# Patient Record
Sex: Female | Born: 1955 | Race: White | Hispanic: No | Marital: Married | Smoking: Former smoker
Health system: Southern US, Community
[De-identification: ages and names within clinical notes are randomized; demographics above are authoritative.]

## PROBLEM LIST (undated history)

## (undated) DIAGNOSIS — K219 Gastro-esophageal reflux disease without esophagitis: Secondary | ICD-10-CM

## (undated) DIAGNOSIS — E669 Obesity, unspecified: Secondary | ICD-10-CM

## (undated) DIAGNOSIS — W19XXXA Unspecified fall, initial encounter: Secondary | ICD-10-CM

## (undated) DIAGNOSIS — I10 Essential (primary) hypertension: Secondary | ICD-10-CM

## (undated) DIAGNOSIS — R0600 Dyspnea, unspecified: Secondary | ICD-10-CM

## (undated) DIAGNOSIS — R002 Palpitations: Secondary | ICD-10-CM

## (undated) DIAGNOSIS — F341 Dysthymic disorder: Secondary | ICD-10-CM

## (undated) DIAGNOSIS — M1990 Unspecified osteoarthritis, unspecified site: Secondary | ICD-10-CM

## (undated) DIAGNOSIS — F419 Anxiety disorder, unspecified: Secondary | ICD-10-CM

## (undated) HISTORY — DX: Dysthymic disorder: F34.1

## (undated) HISTORY — DX: Gastro-esophageal reflux disease without esophagitis: K21.9

## (undated) HISTORY — DX: Anxiety disorder, unspecified: F41.9

## (undated) HISTORY — DX: Dyspnea, unspecified: R06.00

## (undated) HISTORY — DX: Palpitations: R00.2

## (undated) HISTORY — DX: Essential (primary) hypertension: I10

## (undated) HISTORY — DX: Unspecified osteoarthritis, unspecified site: M19.90

## (undated) HISTORY — DX: Obesity, unspecified: E66.9

## (undated) HISTORY — DX: Unspecified fall, initial encounter: W19.XXXA

---

## 2016-10-26 ENCOUNTER — Other Ambulatory Visit: Payer: Self-pay | Admitting: Internal Medicine

## 2016-10-26 DIAGNOSIS — Z1231 Encounter for screening mammogram for malignant neoplasm of breast: Secondary | ICD-10-CM

## 2016-11-28 ENCOUNTER — Ambulatory Visit
Admission: RE | Admit: 2016-11-28 | Discharge: 2016-11-28 | Disposition: A | Discharge status: Home / Self Care | Payer: BLUE CROSS/BLUE SHIELD | Source: Ambulatory Visit | Attending: Internal Medicine | Admitting: Internal Medicine

## 2016-11-28 ENCOUNTER — Encounter: Payer: Self-pay | Admitting: Radiology

## 2016-11-28 DIAGNOSIS — Z1231 Encounter for screening mammogram for malignant neoplasm of breast: Secondary | ICD-10-CM

## 2016-12-29 ENCOUNTER — Encounter: Payer: Self-pay | Admitting: Internal Medicine

## 2017-06-03 ENCOUNTER — Emergency Department (HOSPITAL_BASED_OUTPATIENT_CLINIC_OR_DEPARTMENT_OTHER)
Admission: EM | Admit: 2017-06-03 | Discharge: 2017-06-03 | Disposition: A | Discharge status: Home / Self Care | Payer: BC Managed Care – PPO | Attending: Emergency Medicine | Admitting: Emergency Medicine

## 2017-06-03 ENCOUNTER — Encounter (HOSPITAL_BASED_OUTPATIENT_CLINIC_OR_DEPARTMENT_OTHER): Payer: Self-pay | Admitting: Emergency Medicine

## 2017-06-03 ENCOUNTER — Emergency Department (HOSPITAL_BASED_OUTPATIENT_CLINIC_OR_DEPARTMENT_OTHER): Payer: BC Managed Care – PPO

## 2017-06-03 DIAGNOSIS — W19XXXA Unspecified fall, initial encounter: Secondary | ICD-10-CM

## 2017-06-03 DIAGNOSIS — Y929 Unspecified place or not applicable: Secondary | ICD-10-CM | POA: Insufficient documentation

## 2017-06-03 DIAGNOSIS — Y9301 Activity, walking, marching and hiking: Secondary | ICD-10-CM | POA: Diagnosis not present

## 2017-06-03 DIAGNOSIS — W108XXA Fall (on) (from) other stairs and steps, initial encounter: Secondary | ICD-10-CM | POA: Diagnosis not present

## 2017-06-03 DIAGNOSIS — S32010A Wedge compression fracture of first lumbar vertebra, initial encounter for closed fracture: Secondary | ICD-10-CM | POA: Insufficient documentation

## 2017-06-03 DIAGNOSIS — Y999 Unspecified external cause status: Secondary | ICD-10-CM | POA: Insufficient documentation

## 2017-06-03 DIAGNOSIS — M549 Dorsalgia, unspecified: Secondary | ICD-10-CM

## 2017-06-03 DIAGNOSIS — S3992XA Unspecified injury of lower back, initial encounter: Secondary | ICD-10-CM | POA: Diagnosis present

## 2017-06-03 MED ORDER — MELOXICAM 15 MG PO TABS: 15.0000 mg | ORAL_TABLET | Freq: Every day | ORAL | 1 refills | Status: DC

## 2017-06-03 MED ORDER — CYCLOBENZAPRINE HCL 10 MG PO TABS: 10.0000 mg | ORAL_TABLET | Freq: Every evening | ORAL | 0 refills | Status: DC | PRN

## 2017-06-03 NOTE — ED Notes (Signed)
Alert, NAD, calm, interactive, resps e/u, speaking in clear complete sentences, no dyspnea noted, skin W&D, VSS, reports back pain mildly worse, pt repositioned for comfort, (denies: sob, numbness, tingling, nausea, dizziness or visual changes). Family at [facility].

## 2017-06-03 NOTE — ED Triage Notes (Signed)
PT presents with c/o low back pain after fall Thursday. PT sts she has been using biofreeze, ice and aleve at home. PT sts pain is like a burn now.

## 2017-06-03 NOTE — Discharge Instructions (Signed)
Your xray shows that you have a compression fracture of L1 or the first lumbar vertebrae. I have attached a informational handout on this. Please go to your local drug store and buy a corset back brace. This will prevent you from worsening the compression fracture by sudden movements such as flexion of the spine. Please follow up with neurosurgery in 1 month to ensure that you have routine healing of this area.   For pain control you may take: Mobic as prescribed (please take with food) and acetaminophen 975mg  (this is 3 normal strength, 325mg , over the counter pills) up to four times a day. Please do not take more than this. Do not drink alcohol or combine with other medications that have acetaminophen or Ibuprofen as an ingredient (Read the labels!).   Take muscle relaxers at bedtime as needed. These medications can make you drowsy. Please do not drive or operate heavy machinery while using these medications.   Be aware that if you develop new symptoms, such as a fever, leg weakness, difficulty with or loss of control of your urine or bowels, abdominal pain, or more severe pain, you will need to seek medical attention and/or return to the Emergency department. Additional Information:  Your vital signs today were: BP (!) 168/97 (BP Location: Left Arm)    Pulse 81    Temp 98.1 F (36.7 C) (Oral)    Resp 18    SpO2 100%  If your blood pressure (BP) was elevated above 135/85 this visit, please have this repeated by your doctor within one month. ---------------

## 2017-06-03 NOTE — ED Provider Notes (Signed)
[facility] EMERGENCY DEPARTMENT Provider Note   CSN: [id number] Arrival date & time: 06/03/17  1750     History   Chief Complaint Chief Complaint  Patient presents with  . Fall    HPI Paula Boyer is a 61 y.o. female with no significant past medical history presents to the department today for low back pain since Thursday. Patient states that she slipped at the top of her stairs, landing on her buttocks and sliding down 14 stairs. She denies any head trauma or loss of consciousness. She has been able to ambulate since the event but is having low back pain.she describes her pain as bilateral but primarily on the left side without radiation into the abdomen or down the legs. She has been using Biofreeze every 2 hours, ice and Aleve at home with only mild relief. She states the pain is like a burning, aching pain that she rates as a 2/10. Patient says that the pain is worse after long periods of rest and while lying down for long periods of time. Denies upper back pain or neck pain, numbness/tingling/weakness of the lower extremities, urinary retention, loss of bowel/bladder function or saddle anesthesia. No headache,visual changes or nausea/vomiting. Patient is not on anticoagulation medicine.  HPI  History reviewed. No pertinent past medical history.  There are no active problems to display for this patient.   History reviewed. No pertinent surgical history.  OB History    No data available       Home Medications    Prior to Admission medications   Not on File    Family History No family history on file.  Social History Social History  Substance Use Topics  . Smoking status: Never Smoker  . Smokeless tobacco: Never Used  . Alcohol use No     Allergies   Codeine; Erythromycin; Penicillins; and Sulfa antibiotics   Review of Systems Review of Systems  All other systems reviewed and are negative.    Physical Exam Updated Vital Signs BP (!) 168/97  (BP Location: Left Arm)   Pulse 81   Temp 98.1 F (36.7 C) (Oral)   Resp 18   SpO2 100%   Physical Exam  Constitutional: She appears well-developed and well-nourished. No distress.  Non-toxic appearing  HENT:  Head: Normocephalic and atraumatic.  Right Ear: External ear normal.  Left Ear: External ear normal.  Neck: Normal range of motion. Neck supple. No spinous process tenderness present. No neck rigidity. Normal range of motion present.  Cardiovascular: Normal rate, regular rhythm, normal heart sounds and intact distal pulses.   No murmur heard. Pulses:      Radial pulses are 2+ on the right side, and 2+ on the left side.       Femoral pulses are 2+ on the right side, and 2+ on the left side.      Dorsalis pedis pulses are 2+ on the right side, and 2+ on the left side.       Posterior tibial pulses are 2+ on the right side, and 2+ on the left side.  Pulmonary/Chest: Effort normal and breath sounds normal. No respiratory distress.  Abdominal: Soft. Bowel sounds are normal. She exhibits no pulsatile midline mass. There is no tenderness. There is no rigidity, no rebound and no CVA tenderness.  Musculoskeletal:  Posterior and appearance appears normal. No evidence of obvious scoliosis or kyphosis. No obvious signs of skin changes, trauma, deformity, infection. No C, T spine tenderness or step-offs to palpation. Lumbar  spine TTP. No step offs. No C, T, paraspinal tenderness. Bilateral lumbar paraspinal TTP greater on L. Lung expansion normal. Bilateral lower extremity strength 5 out of 5. Patellar and Achilles deep tendon reflex 2+ and equal bilaterally. Sensation of lower extremities grossly intact. Straight leg right neg. Straight leg left neg. Gait able but patient notes painful. Lower extremity compartments soft. PT and DP 2+ b/l. Cap refill <2 seconds.   Neurological: She is alert. She has normal strength. No sensory deficit.  Mental Status:  Alert, oriented, thought content  appropriate, able to give a coherent history. Speech fluent without evidence of aphasia. Able to follow 2 step commands without difficulty.  Cranial Nerves:  II:  Peripheral visual fields grossly normal, pupils equal, round, reactive to light III,IV, VI: ptosis not present, extra-ocular motions intact bilaterally  V,VII: smile symmetric, eyebrows raise symmetric, facial light touch sensation equal VIII: hearing grossly normal to voice  X: uvula elevates symmetrically  XI: bilateral shoulder shrug symmetric and strong XII: midline tongue extension without fassiculations Motor:  Normal tone. 5/5 in upper and lower extremities bilaterally including strong and equal grip strength and dorsiflexion/plantar flexion Sensory: Sensation intact to light touch in all extremities. Negative Romberg.  Deep Tendon Reflexes: 2+ and symmetric in the biceps and patella Cerebellar: normal finger-to-nose with bilateral upper extremities. Normal heel-to -shin balance bilaterally of the lower extremity. No pronator drift.  Gait: normal gait and balance CV: distal pulses palpable throughout    Skin: Skin is warm, dry and intact. Capillary refill takes less than 2 seconds. No rash noted. She is not diaphoretic. No erythema.  Nursing note and vitals reviewed.    ED Treatments / Results  Labs (all labs ordered are listed, but only abnormal results are displayed) Labs Reviewed - No data to display  EKG  EKG Interpretation None       Radiology Dg Thoracic Spine 2 View  Addendum Date: 06/03/2017   ADDENDUM REPORT: 06/03/2017 20:46 ADDENDUM: Please see the lumbar spine report for detail of a superior endplate compression of L1 better visualized on that study. Electronically Signed   By: Ashley Royalty M.D.   On: 06/03/2017 20:46   Result Date: 06/03/2017 CLINICAL DATA:  Patient fell down steps 4 days ago. Left-sided lumbar pain. Remote back injury 25 years ago. EXAM: THORACIC SPINE 2 VIEWS COMPARISON:  None.  FINDINGS: Twelve ribbed thoracic vertebrae. Maintained thoracic curvature on the lateral view. No acute thoracic spine fracture or bone destruction. The included posterior ribs are intact. Mild multilevel disc space narrowing along the mid to lower thoracic spine as well as at T12-L1. IMPRESSION: Mild thoracic spondylosis.  No acute fracture. Electronically Signed: By: Ashley Royalty M.D. On: 06/03/2017 20:34   Dg Lumbar Spine Complete  Result Date: 06/03/2017 CLINICAL DATA:  Back pain after fall down steps 4 days ago. EXAM: LUMBAR SPINE - COMPLETE 4+ VIEW COMPARISON:  None. FINDINGS: There are 5 non ribbed lumbar vertebrae. There is mild, up to 25% superior endplate compression of the L1 vertebral body without retropulsion. Disc spaces are maintained. There is mild multilevel degenerative facet arthropathy of the lumbar spine. IMPRESSION: Age-indeterminate mild superior endplate compression of L1 without retropulsion. Electronically Signed   By: Ashley Royalty M.D.   On: 06/03/2017 20:41   Dg Pelvis 1-2 Views  Result Date: 06/03/2017 CLINICAL DATA:  Pain after fall down steps four days ago. EXAM: PELVIS - 1-2 VIEW COMPARISON:  None. FINDINGS: There is no evidence of pelvic fracture or diastasis.  Hip joints are maintained without dislocation. The arcuate lines of the sacrum appear intact. The sacroiliac joints and pubic symphysis are maintained. The included lower lumbar spine is nonacute in appearance. No pelvic bone lesions are seen. Soft tissue calcification adjacent to the right greater trochanter may reflect calcific greater trochanteric bursitis or calcific gluteal tendinopathy. IMPRESSION: 1. No acute pelvic fracture. 2. Right greater trochanteric calcific bursitis or gluteal tendinopathy. Electronically Signed   By: Ashley Royalty M.D.   On: 06/03/2017 20:36    Procedures Procedures (including critical care time)  Medications Ordered in ED Medications - No data to display   Initial Impression /  Assessment and Plan / ED Course  I have reviewed the triage vital signs and the nursing notes.  Pertinent labs & imaging results that were available during my care of the patient were reviewed by me and considered in my medical decision making (see chart for details).     61 y.o. female with low back pain after slipping on stairs and landing on buttocks. No head trauma, or LOC. Patient is not on blood thinners. No HA, visual changes or N/V since event. No neurological deficits and normal neuro exam. Do not suspect closed head injury. Patient can walk but states is painful.  No loss of bowel or bladder control or urinary retention.  No concern for cauda equina. Xrays ordered to rule out fracture.   Patient with compression fracture of L1. No retropulsion.  Advised the patient to wear back corset and follow up with neurosurgery to ensure area is healing. Advised NSAIDs, activity modification. Patient denied pain medication in the ED. Return precautions discussed. The patient is in agreement with plan and appears safe for discharge. Patient case discussed with Dr. Leonette Monarch who is in agreement with plan.  Final Clinical Impressions(s) / ED Diagnoses   Final diagnoses:  Back pain  Closed compression fracture of first lumbar vertebra, initial encounter ([place])  Fall, initial encounter    New Prescriptions Discharge Medication List as of 06/03/2017  9:17 PM    START taking these medications   Details  cyclobenzaprine (FLEXERIL) 10 MG tablet Take 1 tablet (10 mg total) by mouth at bedtime as needed for muscle spasms., Starting Sun 06/03/2017, Print    meloxicam (MOBIC) 15 MG tablet Take 1 tablet (15 mg total) by mouth daily., Starting Sun 06/03/2017, Print         Duston Smolenski, [place]th Kirks, PA-C 06/03/17 2202    Fatima Blank, MD 06/04/17 0005

## 2017-06-04 ENCOUNTER — Encounter: Payer: Self-pay | Admitting: Radiology

## 2017-08-14 HISTORY — PX: COLONOSCOPY: SHX174

## 2017-12-14 ENCOUNTER — Encounter: Payer: Self-pay | Admitting: Gastroenterology

## 2018-01-23 ENCOUNTER — Encounter: Payer: Self-pay | Admitting: Gastroenterology

## 2018-02-25 ENCOUNTER — Ambulatory Visit (AMBULATORY_SURGERY_CENTER): Payer: Self-pay | Admitting: *Deleted

## 2018-02-25 VITALS — Ht 63.0 in | Wt 163.8 lb

## 2018-02-25 DIAGNOSIS — Z800 Family history of malignant neoplasm of digestive organs: Secondary | ICD-10-CM

## 2018-02-25 DIAGNOSIS — Z1211 Encounter for screening for malignant neoplasm of colon: Secondary | ICD-10-CM

## 2018-02-25 MED ORDER — NA SULFATE-K SULFATE-MG SULF 17.5-3.13-1.6 GM/177ML PO SOLN: 1.0000 | Freq: Once | ORAL | 0 refills | Status: AC

## 2018-02-25 NOTE — Progress Notes (Signed)
Denies allergies to eggs or soy products. Denies complications with sedation or anesthesia. Denies O2 use. Denies use of diet or weight loss medications.  Emmi instructions given for colonoscopy.  

## 2018-03-01 ENCOUNTER — Encounter: Payer: Self-pay | Admitting: Gastroenterology

## 2018-03-15 ENCOUNTER — Encounter: Payer: Self-pay | Admitting: Gastroenterology

## 2018-03-15 ENCOUNTER — Ambulatory Visit (AMBULATORY_SURGERY_CENTER): Payer: BC Managed Care – PPO | Admitting: Gastroenterology

## 2018-03-15 VITALS — BP 110/87 | HR 69 | Temp 97.8°F | Resp 12 | Ht 63.0 in | Wt 163.0 lb

## 2018-03-15 DIAGNOSIS — D123 Benign neoplasm of transverse colon: Secondary | ICD-10-CM

## 2018-03-15 DIAGNOSIS — Z1211 Encounter for screening for malignant neoplasm of colon: Secondary | ICD-10-CM | POA: Diagnosis not present

## 2018-03-15 MED ORDER — SODIUM CHLORIDE 0.9 % IV SOLN: 500.0000 mL | Freq: Once | INTRAVENOUS | Status: DC

## 2018-03-15 NOTE — Patient Instructions (Signed)
   INFORMATION ON POLYPS,DIVERTICULOSIS,& HEMORRHOIDS GIVEN TO YOU TODAY  AWAIT PATHOLOGY RESULTS    YOU HAD AN ENDOSCOPIC PROCEDURE TODAY AT THE [facility]:   Refer to the procedure report that was given to you for any specific questions about what was found during the examination.  If the procedure report does not answer your questions, please call your gastroenterologist to clarify.  If you requested that your care partner not be given the details of your procedure findings, then the procedure report has been included in a sealed envelope for you to review at your convenience later.  YOU SHOULD EXPECT: Some feelings of bloating in the abdomen. Passage of more gas than usual.  Walking can help get rid of the air that was put into your GI tract during the procedure and reduce the bloating. If you had a lower endoscopy (such as a colonoscopy or flexible sigmoidoscopy) you may notice spotting of blood in your stool or on the toilet paper. If you underwent a bowel prep for your procedure, you may not have a normal bowel movement for a few days.  Please Note:  You might notice some irritation and congestion in your nose or some drainage.  This is from the oxygen used during your procedure.  There is no need for concern and it should clear up in a day or so.  SYMPTOMS TO REPORT IMMEDIATELY:   Following lower endoscopy (colonoscopy or flexible sigmoidoscopy):  Excessive amounts of blood in the stool  Significant tenderness or worsening of abdominal pains  Swelling of the abdomen that is new, acute  Fever of 100F or higher    For urgent or emergent issues, a gastroenterologist can be reached at any hour by calling [phone].   DIET:  We do recommend a small meal at first, but then you may proceed to your regular diet.  Drink plenty of fluids but you should avoid alcoholic beverages for 24 hours.  ACTIVITY:  You should plan to take it easy for the rest of today and you  should NOT DRIVE or use heavy machinery until tomorrow (because of the sedation medicines used during the test).    FOLLOW UP: Our staff will call the number listed on your records the next business day following your procedure to check on you and address any questions or concerns that you may have regarding the information given to you following your procedure. If we do not reach you, we will leave a message.  However, if you are feeling well and you are not experiencing any problems, there is no need to return our call.  We will assume that you have returned to your regular daily activities without incident.  If any biopsies were taken you will be contacted by phone or by letter within the next 1-3 weeks.  Please call us at [phone] if you have not heard about the biopsies in 3 weeks.    SIGNATURES/CONFIDENTIALITY: You and/or your care partner have signed paperwork which will be entered into your electronic medical record.  These signatures attest to the fact that that the information above on your After Visit Summary has been reviewed and is understood.  Full responsibility of the confidentiality of this discharge information lies with you and/or your care-partner. 

## 2018-03-15 NOTE — Progress Notes (Signed)
A/ox3, pleased with MAC, report to RN 

## 2018-03-15 NOTE — Progress Notes (Signed)
Called to room to assist during endoscopic procedure.  Patient ID and intended procedure confirmed with present staff. Received instructions for my participation in the procedure from the performing physician.  

## 2018-03-15 NOTE — Progress Notes (Signed)
Pt's states no medical or surgical changes since previsit or office visit. 

## 2018-03-15 NOTE — Op Note (Signed)
Paula Boyer Name: Paula Boyer Procedure Date: 03/15/2018 11:35 AM MRN: [id number] Endoscopist: Mauri Pole , MD Age: 62 Referring MD:  Date of Birth: Oct 09, 1955 Gender: Female Account #: [id number] Procedure:                Colonoscopy Indications:              Screening for colorectal malignant neoplasm Medicines:                Monitored Anesthesia Care Procedure:                Pre-Anesthesia Assessment:                           - Prior to the procedure, a History and Physical                            was performed, and Boyer medications and                            allergies were reviewed. The Boyer's tolerance of                            previous anesthesia was also reviewed. The risks                            and benefits of the procedure and the sedation                            options and risks were discussed with the Boyer.                            All questions were answered, and informed consent                            was obtained. Prior Anticoagulants: The Boyer has                            taken no previous anticoagulant or antiplatelet                            agents. ASA Grade Assessment: II - A Boyer with                            mild systemic disease. After reviewing the risks                            and benefits, the Boyer was deemed in                            satisfactory condition to undergo the procedure.                           After obtaining informed consent, the colonoscope  was passed under direct vision. Throughout the                            procedure, the Boyer's blood pressure, pulse, and                            oxygen saturations were monitored continuously. The                            Colonoscope was introduced through the anus and                            advanced to the the cecum, identified by                            appendiceal orifice and  ileocecal valve. The                            colonoscopy was performed without difficulty. The                            Boyer tolerated the procedure well. The quality                            of the bowel preparation was excellent. The                            ileocecal valve, appendiceal orifice, and rectum                            were photographed. Scope In: 11:42:06 AM Scope Out: 11:59:21 AM Scope Withdrawal Time: 0 hours 9 minutes 4 seconds  Total Procedure Duration: 0 hours 17 minutes 15 seconds  Findings:                 The perianal and digital rectal examinations were                            normal.                           A 3 mm polyp was found in the hepatic flexure. The                            polyp was sessile. The polyp was removed with a                            cold biopsy forceps. Resection and retrieval were                            complete.                           A few small-mouthed diverticula were found in the  sigmoid colon.                           Non-bleeding internal hemorrhoids were found during                            retroflexion. The hemorrhoids were small. Complications:            No immediate complications. Estimated Blood Loss:     Estimated blood loss was minimal. Impression:               - One 3 mm polyp at the hepatic flexure, removed                            with a cold biopsy forceps. Resected and retrieved.                           - Diverticulosis in the sigmoid colon.                           - Non-bleeding internal hemorrhoids. Recommendation:           - Boyer has a contact number available for                            emergencies. The signs and symptoms of potential                            delayed complications were discussed with the                            Boyer. Return to normal activities tomorrow.                            Written discharge instructions were  provided to the                            Boyer.                           - Resume previous diet.                           - Continue present medications.                           - Await pathology results.                           - Repeat colonoscopy in 5-10 years for surveillance                            based on pathology results. Mauri Pole, MD 03/15/2018 12:03:00 PM This report has been signed electronically.

## 2018-03-18 ENCOUNTER — Telehealth: Payer: Self-pay | Admitting: *Deleted

## 2018-03-18 NOTE — Telephone Encounter (Signed)
  Follow up Call-  Call back number 03/15/2018  Post procedure Call Back phone  # [phone]  Permission to leave phone message Yes   [facility]

## 2018-03-18 NOTE — Telephone Encounter (Signed)
  Follow up Call-  Call back number 03/15/2018  Post procedure Call Back phone  # [phone]  Permission to leave phone message Yes    [facility]

## 2018-03-20 ENCOUNTER — Encounter: Payer: Self-pay | Admitting: Gastroenterology

## 2019-10-10 ENCOUNTER — Ambulatory Visit: Payer: BC Managed Care – PPO | Attending: Internal Medicine

## 2019-10-10 DIAGNOSIS — Z23 Encounter for immunization: Secondary | ICD-10-CM | POA: Insufficient documentation

## 2019-10-10 NOTE — Progress Notes (Signed)
   Covid-19 Vaccination Clinic  Name:  Paula Boyer    MRN: [id number] DOB: 1955-12-07  10/10/2019  Paula Boyer was observed post Covid-19 immunization for 15 minutes without incidence. She was provided with Vaccine Information Sheet and instruction to access the V-Safe system.   Paula Boyer was instructed to call 911 with any severe reactions post vaccine: Marland Kitchen Difficulty breathing  . Swelling of your face and throat  . A fast heartbeat  . A bad rash all over your body  . Dizziness and weakness    Immunizations Administered    Name Date Dose VIS Date Route   Pfizer COVID-19 Vaccine 10/10/2019  1:48 PM 0.3 mL 07/25/2019 Intramuscular   Manufacturer: [place]   Lot: [id number]   [place]: [id number]

## 2019-11-05 ENCOUNTER — Ambulatory Visit: Payer: Self-pay | Attending: Internal Medicine

## 2019-11-05 DIAGNOSIS — Z23 Encounter for immunization: Secondary | ICD-10-CM

## 2019-11-05 NOTE — Progress Notes (Signed)
   Covid-19 Vaccination Clinic  Name:  Paula Boyer    MRN: [id number] DOB: [birth year]  11/05/2019  [name] was observed post Covid-19 immunization for 15 minutes without incident. She was provided with Vaccine Information Sheet and instruction to access the V-Safe system.   [name] was instructed to call 911 with any severe reactions post vaccine: Marland Kitchen Difficulty breathing  . Swelling of face and throat  . A fast heartbeat  . A bad rash all over body  . Dizziness and weakness   Immunizations Administered    Name Date Dose VIS Date Route   Pfizer COVID-19 Vaccine 11/05/2019 11:27 AM 0.3 mL 07/25/2019 Intramuscular   Manufacturer: [place]   Lot: [id number]   [place]: [id number]

## 2020-12-28 DIAGNOSIS — E785 Hyperlipidemia, unspecified: Secondary | ICD-10-CM | POA: Diagnosis not present

## 2020-12-28 DIAGNOSIS — R946 Abnormal results of thyroid function studies: Secondary | ICD-10-CM | POA: Diagnosis not present

## 2021-01-04 DIAGNOSIS — Z0000 Encounter for general adult medical examination without abnormal findings: Secondary | ICD-10-CM | POA: Diagnosis not present

## 2021-01-04 DIAGNOSIS — I10 Essential (primary) hypertension: Secondary | ICD-10-CM | POA: Diagnosis not present

## 2021-01-04 DIAGNOSIS — R82998 Other abnormal findings in urine: Secondary | ICD-10-CM | POA: Diagnosis not present

## 2021-01-04 DIAGNOSIS — F419 Anxiety disorder, unspecified: Secondary | ICD-10-CM | POA: Diagnosis not present

## 2021-01-04 DIAGNOSIS — F341 Dysthymic disorder: Secondary | ICD-10-CM | POA: Diagnosis not present

## 2021-01-04 DIAGNOSIS — Z1212 Encounter for screening for malignant neoplasm of rectum: Secondary | ICD-10-CM | POA: Diagnosis not present

## 2021-01-04 DIAGNOSIS — K219 Gastro-esophageal reflux disease without esophagitis: Secondary | ICD-10-CM | POA: Diagnosis not present

## 2021-01-04 DIAGNOSIS — E669 Obesity, unspecified: Secondary | ICD-10-CM | POA: Diagnosis not present

## 2021-01-04 DIAGNOSIS — M1990 Unspecified osteoarthritis, unspecified site: Secondary | ICD-10-CM | POA: Diagnosis not present

## 2021-01-04 DIAGNOSIS — E785 Hyperlipidemia, unspecified: Secondary | ICD-10-CM | POA: Diagnosis not present

## 2021-01-06 ENCOUNTER — Other Ambulatory Visit: Payer: Self-pay | Admitting: Internal Medicine

## 2021-01-06 DIAGNOSIS — E785 Hyperlipidemia, unspecified: Secondary | ICD-10-CM

## 2021-01-06 DIAGNOSIS — Z0000 Encounter for general adult medical examination without abnormal findings: Secondary | ICD-10-CM

## 2021-01-21 ENCOUNTER — Other Ambulatory Visit: Payer: Self-pay | Admitting: Internal Medicine

## 2021-01-21 DIAGNOSIS — Z1231 Encounter for screening mammogram for malignant neoplasm of breast: Secondary | ICD-10-CM

## 2021-02-17 ENCOUNTER — Other Ambulatory Visit: Payer: Self-pay

## 2021-02-21 DIAGNOSIS — Z13828 Encounter for screening for other musculoskeletal disorder: Secondary | ICD-10-CM | POA: Diagnosis not present

## 2021-02-21 DIAGNOSIS — M4850XS Collapsed vertebra, not elsewhere classified, site unspecified, sequela of fracture: Secondary | ICD-10-CM | POA: Diagnosis not present

## 2021-03-06 ENCOUNTER — Other Ambulatory Visit: Payer: Self-pay

## 2021-03-06 ENCOUNTER — Encounter (HOSPITAL_BASED_OUTPATIENT_CLINIC_OR_DEPARTMENT_OTHER): Payer: Self-pay | Admitting: Emergency Medicine

## 2021-03-06 ENCOUNTER — Emergency Department (HOSPITAL_BASED_OUTPATIENT_CLINIC_OR_DEPARTMENT_OTHER)
Admission: EM | Admit: 2021-03-06 | Discharge: 2021-03-06 | Disposition: A | Discharge status: Home / Self Care | Payer: Medicare PPO | Attending: Emergency Medicine | Admitting: Emergency Medicine

## 2021-03-06 ENCOUNTER — Emergency Department (HOSPITAL_BASED_OUTPATIENT_CLINIC_OR_DEPARTMENT_OTHER): Payer: Medicare PPO

## 2021-03-06 DIAGNOSIS — I10 Essential (primary) hypertension: Secondary | ICD-10-CM | POA: Insufficient documentation

## 2021-03-06 DIAGNOSIS — S199XXA Unspecified injury of neck, initial encounter: Secondary | ICD-10-CM | POA: Diagnosis not present

## 2021-03-06 DIAGNOSIS — S22010A Wedge compression fracture of first thoracic vertebra, initial encounter for closed fracture: Secondary | ICD-10-CM | POA: Insufficient documentation

## 2021-03-06 DIAGNOSIS — M47812 Spondylosis without myelopathy or radiculopathy, cervical region: Secondary | ICD-10-CM | POA: Diagnosis not present

## 2021-03-06 MED ORDER — LIDOCAINE 5 % EX PTCH
1.0000 | MEDICATED_PATCH | Freq: Once | CUTANEOUS | Status: DC
  Administered 2021-03-06: 1 via TRANSDERMAL
  Filled 2021-03-06: qty 1

## 2021-03-06 MED ORDER — METHOCARBAMOL 500 MG PO TABS: 500.0000 mg | ORAL_TABLET | Freq: Two times a day (BID) | ORAL | 0 refills | Status: DC

## 2021-03-06 NOTE — ED Notes (Signed)
Patient transported to CT 
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
## 2021-03-06 NOTE — Discharge Instructions (Addendum)
You have a T1 compression fracture.  Continue using ibuprofen and Tylenol can use prescribed muscle relaxer to help with spasm and pain.  This medication can cause some drowsiness please use with caution.  You can also use over-the-counter Salonpas lidocaine patches for 12 hours.  Please call to schedule follow-up appointment with Dr. Christella Noa for further evaluation of compression fracture.  Return if you develop new numbness or weakness or any other new or concerning symptoms.
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
## 2021-03-17 ENCOUNTER — Ambulatory Visit
Admission: RE | Admit: 2021-03-17 | Discharge: 2021-03-17 | Disposition: A | Discharge status: Home / Self Care | Payer: No Typology Code available for payment source | Source: Ambulatory Visit | Attending: Internal Medicine | Admitting: Internal Medicine

## 2021-03-17 ENCOUNTER — Other Ambulatory Visit: Payer: Self-pay

## 2021-03-17 DIAGNOSIS — E785 Hyperlipidemia, unspecified: Secondary | ICD-10-CM

## 2021-03-17 DIAGNOSIS — Z0000 Encounter for general adult medical examination without abnormal findings: Secondary | ICD-10-CM

## 2021-03-24 DIAGNOSIS — F331 Major depressive disorder, recurrent, moderate: Secondary | ICD-10-CM | POA: Diagnosis not present

## 2021-04-20 DIAGNOSIS — I10 Essential (primary) hypertension: Secondary | ICD-10-CM | POA: Diagnosis not present

## 2021-04-20 DIAGNOSIS — I2584 Coronary atherosclerosis due to calcified coronary lesion: Secondary | ICD-10-CM | POA: Diagnosis not present

## 2021-04-20 DIAGNOSIS — E785 Hyperlipidemia, unspecified: Secondary | ICD-10-CM | POA: Diagnosis not present

## 2021-04-26 DIAGNOSIS — S22010A Wedge compression fracture of first thoracic vertebra, initial encounter for closed fracture: Secondary | ICD-10-CM | POA: Diagnosis not present

## 2021-04-29 ENCOUNTER — Other Ambulatory Visit: Payer: Self-pay | Admitting: Neurosurgery

## 2021-04-29 DIAGNOSIS — S22010A Wedge compression fracture of first thoracic vertebra, initial encounter for closed fracture: Secondary | ICD-10-CM

## 2021-05-11 ENCOUNTER — Other Ambulatory Visit: Payer: Self-pay

## 2021-05-11 ENCOUNTER — Ambulatory Visit
Admission: RE | Admit: 2021-05-11 | Discharge: 2021-05-11 | Disposition: A | Discharge status: Home / Self Care | Payer: Medicare PPO | Source: Ambulatory Visit | Attending: Internal Medicine | Admitting: Internal Medicine

## 2021-05-11 DIAGNOSIS — Z1231 Encounter for screening mammogram for malignant neoplasm of breast: Secondary | ICD-10-CM | POA: Diagnosis not present

## 2021-05-18 DIAGNOSIS — F331 Major depressive disorder, recurrent, moderate: Secondary | ICD-10-CM | POA: Diagnosis not present

## 2022-01-13 DIAGNOSIS — R946 Abnormal results of thyroid function studies: Secondary | ICD-10-CM | POA: Diagnosis not present

## 2022-01-13 DIAGNOSIS — I10 Essential (primary) hypertension: Secondary | ICD-10-CM | POA: Diagnosis not present

## 2022-01-13 DIAGNOSIS — E785 Hyperlipidemia, unspecified: Secondary | ICD-10-CM | POA: Diagnosis not present

## 2022-01-17 DIAGNOSIS — F419 Anxiety disorder, unspecified: Secondary | ICD-10-CM | POA: Diagnosis not present

## 2022-01-17 DIAGNOSIS — E785 Hyperlipidemia, unspecified: Secondary | ICD-10-CM | POA: Diagnosis not present

## 2022-01-17 DIAGNOSIS — I10 Essential (primary) hypertension: Secondary | ICD-10-CM | POA: Diagnosis not present

## 2022-01-20 DIAGNOSIS — Z1339 Encounter for screening examination for other mental health and behavioral disorders: Secondary | ICD-10-CM | POA: Diagnosis not present

## 2022-01-20 DIAGNOSIS — F341 Dysthymic disorder: Secondary | ICD-10-CM | POA: Diagnosis not present

## 2022-01-20 DIAGNOSIS — Z1331 Encounter for screening for depression: Secondary | ICD-10-CM | POA: Diagnosis not present

## 2022-01-20 DIAGNOSIS — I2584 Coronary atherosclerosis due to calcified coronary lesion: Secondary | ICD-10-CM | POA: Diagnosis not present

## 2022-01-20 DIAGNOSIS — I10 Essential (primary) hypertension: Secondary | ICD-10-CM | POA: Diagnosis not present

## 2022-01-20 DIAGNOSIS — F419 Anxiety disorder, unspecified: Secondary | ICD-10-CM | POA: Diagnosis not present

## 2022-01-20 DIAGNOSIS — R002 Palpitations: Secondary | ICD-10-CM | POA: Diagnosis not present

## 2022-01-20 DIAGNOSIS — E669 Obesity, unspecified: Secondary | ICD-10-CM | POA: Diagnosis not present

## 2022-01-20 DIAGNOSIS — E785 Hyperlipidemia, unspecified: Secondary | ICD-10-CM | POA: Diagnosis not present

## 2022-01-20 DIAGNOSIS — K219 Gastro-esophageal reflux disease without esophagitis: Secondary | ICD-10-CM | POA: Diagnosis not present

## 2022-01-20 DIAGNOSIS — M1990 Unspecified osteoarthritis, unspecified site: Secondary | ICD-10-CM | POA: Diagnosis not present

## 2022-01-20 DIAGNOSIS — Z0000 Encounter for general adult medical examination without abnormal findings: Secondary | ICD-10-CM | POA: Diagnosis not present

## 2022-02-17 ENCOUNTER — Ambulatory Visit: Payer: Medicare PPO | Admitting: Interventional Cardiology

## 2022-03-04 NOTE — Progress Notes (Deleted)
Cardiology Office Note:    Date:  03/04/2022   ID:  Paula Boyer, DOB 10/26/1955, MRN [id number]  PCP:  Velna Hatchet, MD  Cardiologist:  None   Referring MD: Velna Hatchet, MD   No chief complaint on file.   History of Present Illness:    Paula Boyer is a 66 y.o. female with a hx of palpitations. Has h/o elevated CAC score, hypertension, and DOE. Referred by Dr. Jackelyn Poling.  ***  Past Medical History:  Diagnosis Date   Anxiety    Dyspnea    Dysthymia    Fall    L1 verterbrae pain   GERD (gastroesophageal reflux disease)    Hypertension    Obesity    Osteoarthritis    Palpitations     Past Surgical History:  Procedure Laterality Date   CESAREAN SECTION  1991    Current Medications: No outpatient medications have been marked as taking for the 03/06/22 encounter (Appointment) with Belva Crome, MD.     Allergies:   Codeine, Erythromycin, Keflex [cephalexin], Penicillins, and Sulfa antibiotics   Social History   Socioeconomic History   Marital status: Married    Spouse name: Not on file   Number of children: Not on file   Years of education: Not on file   Highest education level: Not on file  Occupational History   Not on file  Tobacco Use   Smoking status: Former    Years: 10.00    Types: Cigarettes    Quit date: 02/14/1979    Years since quitting: 43.0   Smokeless tobacco: Never  Vaping Use   Vaping Use: Never used  Substance and Sexual Activity   Alcohol use: Yes    Alcohol/week: 7.0 standard drinks of alcohol    Types: 7 Glasses of wine per week    Comment: social   Drug use: No   Sexual activity: Not on file  Other Topics Concern   Not on file  Social History Narrative   ** Merged History Encounter **       Social Determinants of Health   Financial Resource Strain: Not on file  Food Insecurity: Not on file  Transportation Needs: Not on file  Physical Activity: Not on file  Stress: Not on file  Social Connections: Not on file      Family History: The patient's family history includes Aneurysm in her sister; Breast cancer in her maternal aunt; Colon cancer (age of onset: 41) in her mother; High Cholesterol in her father and sister; Hypertension in her father and sister; Stroke in her father and sister. There is no history of Esophageal cancer, Rectal cancer, or Stomach cancer.  ROS:   Please see the history of present illness.    *** All other systems reviewed and are negative.  EKGs/Labs/Other Studies Reviewed:    The following studies were reviewed today: ***  EKG:  EKG ***  Recent Labs: No results found for requested labs within last 365 days.  Recent Lipid Panel No results found for: "CHOL", "TRIG", "HDL", "CHOLHDL", "VLDL", "LDLCALC", "LDLDIRECT"  Physical Exam:    VS:  There were no vitals taken for this visit.    Wt Readings from Last 3 Encounters:  03/06/21 163 lb (73.9 kg)  03/15/18 163 lb (73.9 kg)  02/25/18 163 lb 12.8 oz (74.3 kg)     GEN: ***. No acute distress HEENT: Normal NECK: No JVD. LYMPHATICS: No lymphadenopathy CARDIAC: *** murmur. RRR *** gallop, or edema. VASCULAR: *** Normal  Pulses. No bruits. RESPIRATORY:  Clear to auscultation without rales, wheezing or rhonchi  ABDOMEN: Soft, non-tender, non-distended, No pulsatile mass, MUSCULOSKELETAL: No deformity  SKIN: Warm and dry NEUROLOGIC:  Alert and oriented x 3 PSYCHIATRIC:  Normal affect   ASSESSMENT:    1. Palpitations   2. Elevated coronary artery calcium score   3. DOE (dyspnea on exertion)   4. Primary hypertension   5. Morbid obesity ([place])    PLAN:    In order of problems listed above:  ***   Medication Adjustments/Labs and Tests Ordered: Current medicines are reviewed at length with the patient today.  Concerns regarding medicines are outlined above.  No orders of the defined types were placed in this encounter.  No orders of the defined types were placed in this encounter.   There are no Patient  Instructions on file for this visit.   Signed, Sinclair Grooms, MD  03/04/2022 1:53 PM    [facility]

## 2022-03-06 ENCOUNTER — Ambulatory Visit: Payer: Medicare PPO | Admitting: Interventional Cardiology

## 2022-03-06 DIAGNOSIS — R931 Abnormal findings on diagnostic imaging of heart and coronary circulation: Secondary | ICD-10-CM

## 2022-03-06 DIAGNOSIS — R002 Palpitations: Secondary | ICD-10-CM

## 2022-03-06 DIAGNOSIS — R0609 Other forms of dyspnea: Secondary | ICD-10-CM

## 2022-03-06 DIAGNOSIS — I10 Essential (primary) hypertension: Secondary | ICD-10-CM

## 2022-04-10 ENCOUNTER — Encounter: Payer: Self-pay | Admitting: Interventional Cardiology

## 2022-04-10 ENCOUNTER — Ambulatory Visit: Payer: Medicare PPO | Attending: Interventional Cardiology | Admitting: Interventional Cardiology

## 2022-04-10 ENCOUNTER — Ambulatory Visit (INDEPENDENT_AMBULATORY_CARE_PROVIDER_SITE_OTHER): Payer: Medicare PPO

## 2022-04-10 VITALS — BP 142/70 | HR 93 | Ht 62.0 in | Wt 161.2 lb

## 2022-04-10 DIAGNOSIS — R931 Abnormal findings on diagnostic imaging of heart and coronary circulation: Secondary | ICD-10-CM | POA: Diagnosis not present

## 2022-04-10 DIAGNOSIS — R002 Palpitations: Secondary | ICD-10-CM

## 2022-04-10 DIAGNOSIS — R0600 Dyspnea, unspecified: Secondary | ICD-10-CM

## 2022-04-10 DIAGNOSIS — Z87891 Personal history of nicotine dependence: Secondary | ICD-10-CM | POA: Diagnosis not present

## 2022-04-10 DIAGNOSIS — E7849 Other hyperlipidemia: Secondary | ICD-10-CM

## 2022-04-10 NOTE — Patient Instructions (Signed)
Medication Instructions:  Your physician recommends that you continue on your current medications as directed. Please refer to the Current Medication list given to you today.  *If you need a refill on your cardiac medications before your next appointment, please call your pharmacy*  Lab Work: NONE  Testing/Procedures: Your physician has recommended you wear a Zio heart monitor for 14 days. This will be mailed to your home with instructions on how to apply the heart monitor and how to return it when finished. Please see more instructions below.  Follow-Up: Will be based on results of heart monitor.  Other Instructions ZIO XT- Long Term Monitor Instructions  Your physician has requested you wear a ZIO patch monitor for 14 days.  This is a single patch monitor. [employer] supplies one patch monitor per enrollment. Additional stickers are not available. Please do not apply patch if you will be having a Nuclear Stress Test,  Echocardiogram, Cardiac CT, MRI, or Chest Xray during the period you would be wearing the  monitor. The patch cannot be worn during these tests. You cannot remove and re-apply the  ZIO XT patch monitor.  Your ZIO patch monitor will be mailed 3 day USPS to your address on file. It may take 3-5 days  to receive your monitor after you have been enrolled.  Once you have received your monitor, please review the enclosed instructions. Your monitor  has already been registered assigning a specific monitor serial # to you.  Billing and Patient Assistance Program Information  We have supplied [employer] with any of your insurance information on file for billing purposes. [employer] offers a sliding scale Patient Assistance Program for patients that do not have  insurance, or whose insurance does not completely cover the cost of the ZIO monitor.  You must apply for the Patient Assistance Program to qualify for this discounted rate.  To apply, please call [employer] at [phone], select  option 4, select option 2, ask to apply for  Patient Assistance Program. Theodore Demark will ask your household income, and how many people  are in your household. They will quote your out-of-pocket cost based on that information.  [employer] will also be able to set up a 25-month interest-free payment plan if needed.  Applying the monitor   Shave hair from upper left chest.  Hold abrader disc by orange tab. Rub abrader in 40 strokes over the upper left chest as  indicated in your monitor instructions.  Clean area with 4 enclosed alcohol pads. Let dry.  Apply patch as indicated in monitor instructions. Patch will be placed under collarbone on left  side of chest with arrow pointing upward.  Rub patch adhesive wings for 2 minutes. Remove white label marked "1". Remove the white  label marked "2". Rub patch adhesive wings for 2 additional minutes.  While looking in a mirror, press and release button in center of patch. A small green light will  flash 3-4 times. This will be your only indicator that the monitor has been turned on.  Do not shower for the first 24 hours. You may shower after the first 24 hours.  Press the button if you feel a symptom. You will hear a small click. Record Date, Time and  Symptom in the Patient Logbook.  When you are ready to remove the patch, follow instructions on the last 2 pages of Patient  Logbook. Stick patch monitor onto the last page of Patient Logbook.  Place Patient Logbook in the blue and white box. Use  locking tab on box and tape box closed  securely. The blue and white box has prepaid postage on it. Please place it in the mailbox as  soon as possible. Your physician should have your test results approximately 7 days after the  monitor has been mailed back to [facility].  Call [place] at [phone] if you have questions regarding  your ZIO XT patch monitor. Call them immediately if you see an orange light blinking on your  monitor.   If your monitor falls off in less than 4 days, contact our Monitor department at [phone].  If your monitor becomes loose or falls off after 4 days call [employer] at [phone] for  suggestions on securing your monitor   Important Information About Sugar
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
## 2022-04-10 NOTE — Progress Notes (Unsigned)
Enrolled for [employer] to mail a ZIO XT long term holter monitor to the patients address on file.  

## 2022-04-17 DIAGNOSIS — R002 Palpitations: Secondary | ICD-10-CM | POA: Diagnosis not present

## 2022-05-06 DIAGNOSIS — R002 Palpitations: Secondary | ICD-10-CM | POA: Diagnosis not present
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
## 2022-09-14 DIAGNOSIS — W19XXXA Unspecified fall, initial encounter: Secondary | ICD-10-CM

## 2022-09-14 HISTORY — DX: Unspecified fall, initial encounter: W19.XXXA

## 2022-10-03 DIAGNOSIS — I10 Essential (primary) hypertension: Secondary | ICD-10-CM | POA: Diagnosis not present

## 2022-10-03 DIAGNOSIS — M546 Pain in thoracic spine: Secondary | ICD-10-CM | POA: Diagnosis not present

## 2022-10-03 DIAGNOSIS — M545 Low back pain, unspecified: Secondary | ICD-10-CM | POA: Diagnosis not present

## 2022-10-03 DIAGNOSIS — M9979 Connective tissue and disc stenosis of intervertebral foramina of abdomen and other regions: Secondary | ICD-10-CM | POA: Diagnosis not present

## 2022-10-13 ENCOUNTER — Other Ambulatory Visit: Payer: Self-pay | Admitting: Internal Medicine

## 2022-10-13 DIAGNOSIS — M9979 Connective tissue and disc stenosis of intervertebral foramina of abdomen and other regions: Secondary | ICD-10-CM

## 2022-10-29 ENCOUNTER — Ambulatory Visit
Admission: RE | Admit: 2022-10-29 | Discharge: 2022-10-29 | Disposition: A | Discharge status: Home / Self Care | Payer: Medicare PPO | Source: Ambulatory Visit | Attending: Internal Medicine | Admitting: Internal Medicine

## 2022-10-29 DIAGNOSIS — M9979 Connective tissue and disc stenosis of intervertebral foramina of abdomen and other regions: Secondary | ICD-10-CM

## 2022-10-29 DIAGNOSIS — M48061 Spinal stenosis, lumbar region without neurogenic claudication: Secondary | ICD-10-CM | POA: Diagnosis not present

## 2022-10-29 DIAGNOSIS — M47816 Spondylosis without myelopathy or radiculopathy, lumbar region: Secondary | ICD-10-CM | POA: Diagnosis not present

## 2022-10-29 DIAGNOSIS — M545 Low back pain, unspecified: Secondary | ICD-10-CM | POA: Diagnosis not present

## 2022-10-29 DIAGNOSIS — M4126 Other idiopathic scoliosis, lumbar region: Secondary | ICD-10-CM | POA: Diagnosis not present

## 2022-11-22 DIAGNOSIS — S22070D Wedge compression fracture of T9-T10 vertebra, subsequent encounter for fracture with routine healing: Secondary | ICD-10-CM | POA: Diagnosis not present

## 2022-11-22 DIAGNOSIS — M545 Low back pain, unspecified: Secondary | ICD-10-CM | POA: Diagnosis not present

## 2022-12-14 DIAGNOSIS — I10 Essential (primary) hypertension: Secondary | ICD-10-CM | POA: Diagnosis not present

## 2022-12-20 DIAGNOSIS — S22070D Wedge compression fracture of T9-T10 vertebra, subsequent encounter for fracture with routine healing: Secondary | ICD-10-CM | POA: Diagnosis not present

## 2022-12-26 DIAGNOSIS — M545 Low back pain, unspecified: Secondary | ICD-10-CM | POA: Diagnosis not present

## 2022-12-27 DIAGNOSIS — S22070D Wedge compression fracture of T9-T10 vertebra, subsequent encounter for fracture with routine healing: Secondary | ICD-10-CM | POA: Diagnosis not present

## 2023-01-17 DIAGNOSIS — I10 Essential (primary) hypertension: Secondary | ICD-10-CM | POA: Diagnosis not present

## 2023-01-17 DIAGNOSIS — R946 Abnormal results of thyroid function studies: Secondary | ICD-10-CM | POA: Diagnosis not present

## 2023-01-17 DIAGNOSIS — E785 Hyperlipidemia, unspecified: Secondary | ICD-10-CM | POA: Diagnosis not present

## 2023-01-17 DIAGNOSIS — K219 Gastro-esophageal reflux disease without esophagitis: Secondary | ICD-10-CM | POA: Diagnosis not present

## 2023-01-18 DIAGNOSIS — S22070S Wedge compression fracture of T9-T10 vertebra, sequela: Secondary | ICD-10-CM | POA: Diagnosis not present

## 2023-01-18 DIAGNOSIS — M6289 Other specified disorders of muscle: Secondary | ICD-10-CM | POA: Diagnosis not present

## 2023-01-18 DIAGNOSIS — R293 Abnormal posture: Secondary | ICD-10-CM | POA: Diagnosis not present

## 2023-01-18 DIAGNOSIS — R531 Weakness: Secondary | ICD-10-CM | POA: Diagnosis not present

## 2023-01-18 DIAGNOSIS — M546 Pain in thoracic spine: Secondary | ICD-10-CM | POA: Diagnosis not present

## 2023-01-18 DIAGNOSIS — R2689 Other abnormalities of gait and mobility: Secondary | ICD-10-CM | POA: Diagnosis not present

## 2023-01-18 DIAGNOSIS — G8929 Other chronic pain: Secondary | ICD-10-CM | POA: Diagnosis not present

## 2023-01-18 DIAGNOSIS — M2560 Stiffness of unspecified joint, not elsewhere classified: Secondary | ICD-10-CM | POA: Diagnosis not present

## 2023-01-31 DIAGNOSIS — M2560 Stiffness of unspecified joint, not elsewhere classified: Secondary | ICD-10-CM | POA: Diagnosis not present

## 2023-01-31 DIAGNOSIS — R293 Abnormal posture: Secondary | ICD-10-CM | POA: Diagnosis not present

## 2023-01-31 DIAGNOSIS — R2689 Other abnormalities of gait and mobility: Secondary | ICD-10-CM | POA: Diagnosis not present

## 2023-01-31 DIAGNOSIS — M546 Pain in thoracic spine: Secondary | ICD-10-CM | POA: Diagnosis not present

## 2023-01-31 DIAGNOSIS — R531 Weakness: Secondary | ICD-10-CM | POA: Diagnosis not present

## 2023-01-31 DIAGNOSIS — S22070S Wedge compression fracture of T9-T10 vertebra, sequela: Secondary | ICD-10-CM | POA: Diagnosis not present

## 2023-01-31 DIAGNOSIS — G8929 Other chronic pain: Secondary | ICD-10-CM | POA: Diagnosis not present

## 2023-01-31 DIAGNOSIS — M6289 Other specified disorders of muscle: Secondary | ICD-10-CM | POA: Diagnosis not present

## 2023-02-14 DIAGNOSIS — R531 Weakness: Secondary | ICD-10-CM | POA: Diagnosis not present

## 2023-02-14 DIAGNOSIS — M2560 Stiffness of unspecified joint, not elsewhere classified: Secondary | ICD-10-CM | POA: Diagnosis not present

## 2023-02-14 DIAGNOSIS — M546 Pain in thoracic spine: Secondary | ICD-10-CM | POA: Diagnosis not present

## 2023-02-14 DIAGNOSIS — R293 Abnormal posture: Secondary | ICD-10-CM | POA: Diagnosis not present

## 2023-02-14 DIAGNOSIS — G8929 Other chronic pain: Secondary | ICD-10-CM | POA: Diagnosis not present

## 2023-02-14 DIAGNOSIS — S22070S Wedge compression fracture of T9-T10 vertebra, sequela: Secondary | ICD-10-CM | POA: Diagnosis not present

## 2023-02-14 DIAGNOSIS — R2689 Other abnormalities of gait and mobility: Secondary | ICD-10-CM | POA: Diagnosis not present

## 2023-02-14 DIAGNOSIS — M6289 Other specified disorders of muscle: Secondary | ICD-10-CM | POA: Diagnosis not present

## 2023-02-21 DIAGNOSIS — R531 Weakness: Secondary | ICD-10-CM | POA: Diagnosis not present

## 2023-02-21 DIAGNOSIS — R2689 Other abnormalities of gait and mobility: Secondary | ICD-10-CM | POA: Diagnosis not present

## 2023-02-21 DIAGNOSIS — S22070S Wedge compression fracture of T9-T10 vertebra, sequela: Secondary | ICD-10-CM | POA: Diagnosis not present

## 2023-02-21 DIAGNOSIS — G8929 Other chronic pain: Secondary | ICD-10-CM | POA: Diagnosis not present

## 2023-02-21 DIAGNOSIS — M6289 Other specified disorders of muscle: Secondary | ICD-10-CM | POA: Diagnosis not present

## 2023-02-21 DIAGNOSIS — M546 Pain in thoracic spine: Secondary | ICD-10-CM | POA: Diagnosis not present

## 2023-02-21 DIAGNOSIS — M2560 Stiffness of unspecified joint, not elsewhere classified: Secondary | ICD-10-CM | POA: Diagnosis not present

## 2023-02-21 DIAGNOSIS — R293 Abnormal posture: Secondary | ICD-10-CM | POA: Diagnosis not present

## 2023-02-27 DIAGNOSIS — I10 Essential (primary) hypertension: Secondary | ICD-10-CM | POA: Diagnosis not present

## 2023-02-27 DIAGNOSIS — Z1331 Encounter for screening for depression: Secondary | ICD-10-CM | POA: Diagnosis not present

## 2023-02-27 DIAGNOSIS — Z1339 Encounter for screening examination for other mental health and behavioral disorders: Secondary | ICD-10-CM | POA: Diagnosis not present

## 2023-02-27 DIAGNOSIS — F341 Dysthymic disorder: Secondary | ICD-10-CM | POA: Diagnosis not present

## 2023-02-27 DIAGNOSIS — R82998 Other abnormal findings in urine: Secondary | ICD-10-CM | POA: Diagnosis not present

## 2023-02-27 DIAGNOSIS — E669 Obesity, unspecified: Secondary | ICD-10-CM | POA: Diagnosis not present

## 2023-02-27 DIAGNOSIS — I2584 Coronary atherosclerosis due to calcified coronary lesion: Secondary | ICD-10-CM | POA: Diagnosis not present

## 2023-02-27 DIAGNOSIS — Z0000 Encounter for general adult medical examination without abnormal findings: Secondary | ICD-10-CM | POA: Diagnosis not present

## 2023-02-27 DIAGNOSIS — E785 Hyperlipidemia, unspecified: Secondary | ICD-10-CM | POA: Diagnosis not present

## 2023-03-15 DIAGNOSIS — Z780 Asymptomatic menopausal state: Secondary | ICD-10-CM | POA: Diagnosis not present

## 2023-04-04 DIAGNOSIS — M19049 Primary osteoarthritis, unspecified hand: Secondary | ICD-10-CM | POA: Diagnosis not present

## 2023-04-04 DIAGNOSIS — K219 Gastro-esophageal reflux disease without esophagitis: Secondary | ICD-10-CM | POA: Diagnosis not present

## 2023-04-04 DIAGNOSIS — I10 Essential (primary) hypertension: Secondary | ICD-10-CM | POA: Diagnosis not present

## 2023-04-10 DIAGNOSIS — S22070D Wedge compression fracture of T9-T10 vertebra, subsequent encounter for fracture with routine healing: Secondary | ICD-10-CM | POA: Diagnosis not present

## 2023-05-08 ENCOUNTER — Encounter: Payer: Self-pay | Admitting: Gastroenterology

## 2023-06-08 ENCOUNTER — Ambulatory Visit (AMBULATORY_SURGERY_CENTER): Payer: Medicare PPO

## 2023-06-08 ENCOUNTER — Encounter: Payer: Self-pay | Admitting: Gastroenterology

## 2023-06-08 VITALS — Ht 62.0 in | Wt 152.0 lb

## 2023-06-08 DIAGNOSIS — Z860100 Personal history of colon polyps, unspecified: Secondary | ICD-10-CM

## 2023-06-08 MED ORDER — NA SULFATE-K SULFATE-MG SULF 17.5-3.13-1.6 GM/177ML PO SOLN: 1.0000 | Freq: Once | ORAL | 0 refills | Status: AC

## 2023-06-08 NOTE — Progress Notes (Signed)
Pre visit completed via phone call; Patient verified name, DOB, and address; No egg or soy allergy known to patient; No issues known to pt with past sedation with any surgeries or procedures; Patient denies ever being told they had issues or difficulty with intubation;  No FH of Malignant Hyperthermia; Pt is not on diet pills; Pt is not on home 02;  Pt is not on blood thinners;  Pt denies issues with constipation;  No A fib or A flutter; Have any cardiac testing pending--NO Insurance verified during PV appt--- [employer] Pt can ambulate without assistance;  Pt denies use of chewing tobacco; Discussed diabetic/weight loss medication holds; Discussed NSAID holds; Checked BMI to be less than 50; Pt instructed to use Single[employer] or GoodRx for a price reduction on prep;  Patient's chart reviewed by Cathlyn Parsons CNRA prior to previsit and patient appropriate for the [facility];  Pre visit completed and red dot placed by patient's name on their procedure day (on provider's schedule);    Instructions sent to MyChart as well as printed and mailed to the patient per her request;

## 2023-06-11 DIAGNOSIS — S22080S Wedge compression fracture of T11-T12 vertebra, sequela: Secondary | ICD-10-CM | POA: Diagnosis not present

## 2023-06-11 DIAGNOSIS — R55 Syncope and collapse: Secondary | ICD-10-CM | POA: Diagnosis not present

## 2023-06-11 DIAGNOSIS — Z23 Encounter for immunization: Secondary | ICD-10-CM | POA: Diagnosis not present

## 2023-06-11 DIAGNOSIS — M810 Age-related osteoporosis without current pathological fracture: Secondary | ICD-10-CM | POA: Diagnosis not present

## 2023-06-11 DIAGNOSIS — I471 Supraventricular tachycardia, unspecified: Secondary | ICD-10-CM | POA: Diagnosis not present

## 2023-06-26 ENCOUNTER — Encounter: Payer: Self-pay | Admitting: Certified Registered Nurse Anesthetist

## 2023-06-27 ENCOUNTER — Encounter: Payer: Self-pay | Admitting: Gastroenterology

## 2023-06-27 ENCOUNTER — Ambulatory Visit: Payer: Medicare PPO | Admitting: Gastroenterology

## 2023-06-27 VITALS — BP 138/81 | HR 76 | Temp 98.0°F | Resp 12 | Ht 62.0 in | Wt 152.0 lb

## 2023-06-27 DIAGNOSIS — D122 Benign neoplasm of ascending colon: Secondary | ICD-10-CM | POA: Diagnosis not present

## 2023-06-27 DIAGNOSIS — Z860100 Personal history of colon polyps, unspecified: Secondary | ICD-10-CM | POA: Diagnosis not present

## 2023-06-27 DIAGNOSIS — D123 Benign neoplasm of transverse colon: Secondary | ICD-10-CM

## 2023-06-27 DIAGNOSIS — Z09 Encounter for follow-up examination after completed treatment for conditions other than malignant neoplasm: Secondary | ICD-10-CM | POA: Diagnosis not present

## 2023-06-27 DIAGNOSIS — Z1211 Encounter for screening for malignant neoplasm of colon: Secondary | ICD-10-CM | POA: Diagnosis not present

## 2023-06-27 DIAGNOSIS — Z860101 Personal history of adenomatous and serrated colon polyps: Secondary | ICD-10-CM | POA: Diagnosis not present

## 2023-06-27 DIAGNOSIS — I10 Essential (primary) hypertension: Secondary | ICD-10-CM | POA: Diagnosis not present

## 2023-06-27 DIAGNOSIS — F419 Anxiety disorder, unspecified: Secondary | ICD-10-CM | POA: Diagnosis not present

## 2023-06-27 MED ORDER — SODIUM CHLORIDE 0.9 % IV SOLN: 500.0000 mL | Freq: Once | INTRAVENOUS | Status: DC

## 2023-06-27 NOTE — Patient Instructions (Addendum)
Handouts given on polyps, hemorrhoids and diverticulosis.  YOU HAD AN ENDOSCOPIC PROCEDURE TODAY AT THE [facility]:   Refer to the procedure report that was given to you for any specific questions about what was found during the examination.  If the procedure report does not answer your questions, please call your gastroenterologist to clarify.  If you requested that your care partner not be given the details of your procedure findings, then the procedure report has been included in a sealed envelope for you to review at your convenience later.  YOU SHOULD EXPECT: Some feelings of bloating in the abdomen. Passage of more gas than usual.  Walking can help get rid of the air that was put into your GI tract during the procedure and reduce the bloating. If you had a lower endoscopy (such as a colonoscopy or flexible sigmoidoscopy) you may notice spotting of blood in your stool or on the toilet paper. If you underwent a bowel prep for your procedure, you may not have a normal bowel movement for a few days.  Please Note:  You might notice some irritation and congestion in your nose or some drainage.  This is from the oxygen used during your procedure.  There is no need for concern and it should clear up in a day or so.  SYMPTOMS TO REPORT IMMEDIATELY:  Following lower endoscopy (colonoscopy or flexible sigmoidoscopy):  Excessive amounts of blood in the stool  Significant tenderness or worsening of abdominal pains  Swelling of the abdomen that is new, acute  Fever of 100F or higher  For urgent or emergent issues, a gastroenterologist can be reached at any hour by calling [phone]. Do not use MyChart messaging for urgent concerns.    DIET:  We do recommend a small meal at first, but then you may proceed to your regular diet.  Drink plenty of fluids but you should avoid alcoholic beverages for 24 hours.  ACTIVITY:  You should plan to take it easy for the rest of today and you should  NOT DRIVE or use heavy machinery until tomorrow (because of the sedation medicines used during the test).    FOLLOW UP: Our staff will call the number listed on your records the next business day following your procedure.  We will call around 7:15- 8:00 am to check on you and address any questions or concerns that you may have regarding the information given to you following your procedure. If we do not reach you, we will leave a message.     If any biopsies were taken you will be contacted by phone or by letter within the next 1-3 weeks.  Please call us at [phone] if you have not heard about the biopsies in 3 weeks.    SIGNATURES/CONFIDENTIALITY: You and/or your care partner have signed paperwork which will be entered into your electronic medical record.  These signatures attest to the fact that that the information above on your After Visit Summary has been reviewed and is understood.  Full responsibility of the confidentiality of this discharge information lies with you and/or your care-partner. 

## 2023-06-27 NOTE — Op Note (Signed)
[facility] Patient Name: Paula Boyer Procedure Date: 06/27/2023 2:39 PM MRN: [id number] Endoscopist: Napoleon Form , MD, [id number] Age: 67 Referring MD:  Date of Birth: 1955/08/16 Gender: Female Account #: [id number] Procedure:                Colonoscopy Indications:              High risk colon cancer surveillance: Personal                            history of colonic polyps, High risk colon cancer                            surveillance: Personal history of adenoma less than                            10 mm in size Medicines:                Monitored Anesthesia Care Procedure:                Pre-Anesthesia Assessment:                           - Prior to the procedure, a History and Physical                            was performed, and patient medications and                            allergies were reviewed. The patient's tolerance of                            previous anesthesia was also reviewed. The risks                            and benefits of the procedure and the sedation                            options and risks were discussed with the patient.                            All questions were answered, and informed consent                            was obtained. Prior Anticoagulants: The patient has                            taken no anticoagulant or antiplatelet agents. ASA                            Grade Assessment: II - A patient with mild systemic                            disease. After reviewing the risks and benefits,  the patient was deemed in satisfactory condition to                            undergo the procedure.                           After obtaining informed consent, the colonoscope                            was passed under direct vision. Throughout the                            procedure, the patient's blood pressure, pulse, and                            oxygen saturations were monitored  continuously. The                            Olympus Scope [id number] was introduced through the                            anus and advanced to the the cecum, identified by                            appendiceal orifice and ileocecal valve. The                            colonoscopy was performed without difficulty. The                            patient tolerated the procedure well. The quality                            of the bowel preparation was good. The ileocecal                            valve, appendiceal orifice, and rectum were                            photographed. Scope In: 2:51:08 PM Scope Out: 3:06:51 PM Scope Withdrawal Time: 0 hours 9 minutes 28 seconds  Total Procedure Duration: 0 hours 15 minutes 43 seconds  Findings:                 The perianal and digital rectal examinations were                            normal.                           Three sessile polyps were found in the ascending                            colon. The polyps were 4 to 11 mm in size. These  polyps were removed with a cold snare. Resection                            and retrieval were complete.                           Two sessile polyps were found in the hepatic                            flexure. The polyps were 1 to 2 mm in size. These                            polyps were removed with a cold biopsy forceps.                            Resection and retrieval were complete.                           A few small-mouthed diverticula were found in the                            sigmoid colon and descending colon.                           Non-bleeding external and internal hemorrhoids were                            found during retroflexion. The hemorrhoids were                            small. Complications:            No immediate complications. Estimated Blood Loss:     Estimated blood loss was minimal. Impression:               - Three 4 to 11 mm polyps in  the ascending colon,                            removed with a cold snare. Resected and retrieved.                           - Two 1 to 2 mm polyps at the hepatic flexure,                            removed with a cold biopsy forceps. Resected and                            retrieved.                           - Diverticulosis in the sigmoid colon and in the                            descending colon.                           -  Non-bleeding external and internal hemorrhoids. Recommendation:           - Patient has a contact number available for                            emergencies. The signs and symptoms of potential                            delayed complications were discussed with the                            patient. Return to normal activities tomorrow.                            Written discharge instructions were provided to the                            patient.                           - Resume previous diet.                           - Continue present medications.                           - Await pathology results.                           - Repeat colonoscopy in 3 years for surveillance                            based on pathology results. Napoleon Form, MD 06/27/2023 3:13:31 PM This report has been signed electronically.

## 2023-06-27 NOTE — Progress Notes (Signed)
Pt's states no medical or surgical changes since previsit or office visit. 

## 2023-06-27 NOTE — Progress Notes (Signed)
[facility] History and Physical   Primary Care Physician:  Alysia Penna, MD   Reason for Procedure:  History of adenomatous colon polyps  Plan:    Surveillance colonoscopy with possible interventions as needed     HPI: Paula Boyer is a very pleasant 67 y.o. female here for surveillance colonoscopy. Denies any nausea, vomiting, abdominal pain, melena or bright red blood per rectum  The risks and benefits as well as alternatives of endoscopic procedure(s) have been discussed and reviewed. All questions answered. The patient agrees to proceed.    Past Medical History:  Diagnosis Date   Anxiety    Dyspnea    Dysthymia    Fall 09/2022   L1 verterbrae pain   GERD (gastroesophageal reflux disease)    Hypertension    Obesity    Osteoarthritis    Palpitations     Past Surgical History:  Procedure Laterality Date   CESAREAN SECTION  1991   COLONOSCOPY  2019   KN-MAC-suprep(Exc)-tics/TA's-16yr recall    Prior to Admission medications   Medication Sig Start Date End Date Taking? Authorizing Provider  Cholecalciferol (VITAMIN D3) 25 MCG (1000 UT) CAPS Take 1 capsule by mouth daily at 6 (six) AM. 04/04/23  Yes [provider]  losartan (COZAAR) 100 MG tablet Take 1 tablet by mouth daily. 01/30/22  Yes [provider]  metoprolol succinate (TOPROL-XL) 25 MG 24 hr tablet Take 12.5 mg by mouth daily. 06/11/23  Yes [provider]  Multiple Vitamin (MULTIVITAMIN ADULT PO) Take 1 Dose by mouth daily at 6 (six) AM. Liquid form   Yes [provider]  alendronate (FOSAMAX) 70 MG tablet Take 70 mg by mouth once a week. 06/15/23   [provider]  REPATHA SURECLICK 140 MG/ML SOAJ Inject into the skin every 14 (fourteen) days. 03/30/22   [provider]    Current Outpatient Medications  Medication Sig Dispense Refill   Cholecalciferol (VITAMIN D3) 25 MCG (1000 UT) CAPS Take 1 capsule by mouth daily at 6 (six) AM.      losartan (COZAAR) 100 MG tablet Take 1 tablet by mouth daily.     metoprolol succinate (TOPROL-XL) 25 MG 24 hr tablet Take 12.5 mg by mouth daily.     Multiple Vitamin (MULTIVITAMIN ADULT PO) Take 1 Dose by mouth daily at 6 (six) AM. Liquid form     alendronate (FOSAMAX) 70 MG tablet Take 70 mg by mouth once a week.     REPATHA SURECLICK 140 MG/ML SOAJ Inject into the skin every 14 (fourteen) days.     Current Facility-Administered Medications  Medication Dose Route Frequency Provider Last Rate Last Admin   0.9 %  sodium chloride infusion  500 mL Intravenous Once Napoleon Form, MD        Allergies as of 06/27/2023 - Review Complete 06/27/2023  Allergen Reaction Noted   Codeine  06/03/2017   Erythromycin  06/03/2017   Keflex [cephalexin]  02/25/2018   Macrolides and ketolides  06/08/2023   Nsaids  09/29/2016   Penicillin g  12/11/2013   Penicillins  06/03/2017   Sulfa antibiotics  12/11/2013   Tetracycline  09/29/2016    Family History  Problem Relation Age of Onset   Colon polyps Mother    Colon cancer Mother 33   Lung cancer Mother 53       mets from colon   Stroke Father    Hypertension Father    High Cholesterol Father    Colon polyps Sister  32   High Cholesterol Sister    Stroke Sister    Hypertension Sister    Aneurysm Sister    Breast cancer Maternal Aunt    Esophageal cancer Neg Hx    Rectal cancer Neg Hx    Stomach cancer Neg Hx     Social History   Socioeconomic History   Marital status: Married    Spouse name: Not on file   Number of children: Not on file   Years of education: Not on file   Highest education level: Not on file  Occupational History   Not on file  Tobacco Use   Smoking status: Former    Current packs/day: 0.00    Types: Cigarettes    Start date: 02/13/1969    Quit date: 02/14/1979    Years since quitting: 44.3   Smokeless tobacco: Never  Vaping Use   Vaping status: Never Used  Substance and Sexual Activity   Alcohol use: Yes     Alcohol/week: 7.0 standard drinks of alcohol    Types: 7 Glasses of wine per week    Comment: social   Drug use: No   Sexual activity: Not on file  Other Topics Concern   Not on file  Social History Narrative   ** Merged History Encounter **       Social Determinants of Health   Financial Resource Strain: Not on file  Food Insecurity: Not on file  Transportation Needs: Not on file  Physical Activity: Not on file  Stress: Not on file  Social Connections: Not on file  Intimate Partner Violence: Not on file    Review of Systems:  All other review of systems negative except as mentioned in the HPI.  Physical Exam: Vital signs in last 24 hours: BP (!) 166/79   Pulse 89   Temp 98 F (36.7 C)   Ht 5\' 2"  (1.575 m)   Wt 152 lb (68.9 kg)   SpO2 100%   BMI 27.80 kg/m  General:   Alert, NAD Lungs:  Clear .   Heart:  Regular rate and rhythm Abdomen:  Soft, nontender and nondistended. Neuro/Psych:  Alert and cooperative. Normal mood and affect. A and O x 3  Reviewed labs, radiology imaging, old records and pertinent past GI work up  Patient is appropriate for planned procedure(s) and anesthesia in an ambulatory setting   K. Scherry Ran , MD [phone]

## 2023-06-27 NOTE — Progress Notes (Signed)
1453 Patient experiencing nausea and retching.  MD updated and Zofran 4 mg IV given, vss

## 2023-06-27 NOTE — Progress Notes (Signed)
Called to room to assist during endoscopic procedure.  Patient ID and intended procedure confirmed with present staff. Received instructions for my participation in the procedure from the performing physician.  

## 2023-06-27 NOTE — Progress Notes (Signed)
Report given to PACU, vss 

## 2023-06-28 ENCOUNTER — Telehealth: Payer: Self-pay

## 2023-06-28 NOTE — Telephone Encounter (Signed)
Left message on follow up call. 

## 2023-07-02 LAB — SURGICAL PATHOLOGY

## 2023-07-05 ENCOUNTER — Ambulatory Visit (INDEPENDENT_AMBULATORY_CARE_PROVIDER_SITE_OTHER): Payer: Medicare PPO

## 2023-07-05 ENCOUNTER — Encounter: Payer: Self-pay | Admitting: Internal Medicine

## 2023-07-05 ENCOUNTER — Ambulatory Visit: Payer: Medicare PPO | Attending: Internal Medicine | Admitting: Internal Medicine

## 2023-07-05 VITALS — BP 142/80 | HR 79 | Ht 62.0 in | Wt 155.0 lb

## 2023-07-05 DIAGNOSIS — E7849 Other hyperlipidemia: Secondary | ICD-10-CM

## 2023-07-05 DIAGNOSIS — R002 Palpitations: Secondary | ICD-10-CM | POA: Diagnosis not present

## 2023-07-05 MED ORDER — ROSUVASTATIN CALCIUM 5 MG PO TABS: 5.0000 mg | ORAL_TABLET | Freq: Every day | ORAL | 3 refills | Status: AC
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
## 2023-07-05 NOTE — Patient Instructions (Signed)
Medication Instructions:  Start Crestor 5 mg daily *If you need a refill on your cardiac medications before your next appointment, please call your pharmacy*   Lab Work: Lipid panel in 3 months  If you have labs (blood work) drawn today and your tests are completely normal, you will receive your results only by: MyChart Message (if you have MyChart) OR A paper copy in the mail If you have any lab test that is abnormal or we need to change your treatment, we will call you to review the results.   Testing/Procedures: Echo next available  Your physician has requested that you have an echocardiogram. Echocardiography is a painless test that uses sound waves to create images of your heart. It provides your doctor with information about the size and shape of your heart and how well your heart's chambers and valves are working. This procedure takes approximately one hour. There are no restrictions for this procedure. Please do NOT wear cologne, perfume, aftershave, or lotions (deodorant is allowed). Please arrive 15 minutes prior to your appointment time.  Please note: We ask at that you not bring children with you during ultrasound (echo/ vascular) testing. Due to room size and safety concerns, children are not allowed in the ultrasound rooms during exams. Our front office staff cannot provide observation of children in our lobby area while testing is being conducted. An adult accompanying a patient to their appointment will only be allowed in the ultrasound room at the discretion of the ultrasound technician under special circumstances. We apologize for any inconvenience.    Follow-Up: At [facility], you and your health needs are our priority.  As part of our continuing mission to provide you with exceptional heart care, we have created designated Provider Care Teams.  These Care Teams include your primary Cardiologist (physician) and Advanced Practice Providers (APPs -  Physician  Assistants and Nurse Practitioners) who all work together to provide you with the care you need, when you need it.   Your next appointment:   6 month(s)  Provider:   Dr.Mary Branch     Other Instructions  Please Monitor blood pressures at home   ZIO XT- Long Term Monitor Instructions  Your physician has requested you wear a ZIO patch monitor for 14 days.  This is a single patch monitor. [employer] supplies one patch monitor per enrollment. Additional stickers are not available. Please do not apply patch if you will be having a Nuclear Stress Test,  Echocardiogram, Cardiac CT, MRI, or Chest Xray during the period you would be wearing the  monitor. The patch cannot be worn during these tests. You cannot remove and re-apply the  ZIO XT patch monitor.  Your ZIO patch monitor will be mailed 3 day USPS to your address on file. It may take 3-5 days  to receive your monitor after you have been enrolled.  Once you have received your monitor, please review the enclosed instructions. Your monitor  has already been registered assigning a specific monitor serial # to you.  Billing and Patient Assistance Program Information  We have supplied [employer] with any of your insurance information on file for billing purposes. [employer] offers a sliding scale Patient Assistance Program for patients that do not have  insurance, or whose insurance does not completely cover the cost of the ZIO monitor.  You must apply for the Patient Assistance Program to qualify for this discounted rate.  To apply, please call [employer] at [phone], select option 4, select option 2,  ask to apply for  Patient Assistance Program. Meredeth Ide will ask your household income, and how many people  are in your household. They will quote your out-of-pocket cost based on that information.  [employer] will also be able to set up a 36-month, interest-free payment plan if needed.  Applying the monitor   Shave hair from upper left chest.   Hold abrader disc by orange tab. Rub abrader in 40 strokes over the upper left chest as  indicated in your monitor instructions.  Clean area with 4 enclosed alcohol pads. Let dry.  Apply patch as indicated in monitor instructions. Patch will be placed under collarbone on left  side of chest with arrow pointing upward.  Rub patch adhesive wings for 2 minutes. Remove white label marked "1". Remove the white  label marked "2". Rub patch adhesive wings for 2 additional minutes.  While looking in a mirror, press and release button in center of patch. A small green light will  flash 3-4 times. This will be your only indicator that the monitor has been turned on.  Do not shower for the first 24 hours. You may shower after the first 24 hours.  Press the button if you feel a symptom. You will hear a small click. Record Date, Time and  Symptom in the Patient Logbook.  When you are ready to remove the patch, follow instructions on the last 2 pages of Patient  Logbook. Stick patch monitor onto the last page of Patient Logbook.  Place Patient Logbook in the blue and white box. Use locking tab on box and tape box closed  securely. The blue and white box has prepaid postage on it. Please place it in the mailbox as  soon as possible. Your physician should have your test results approximately 7 days after the  monitor has been mailed back to [facility].  Call [facility] Customer Care at [phone] if you have questions regarding  your ZIO XT patch monitor. Call them immediately if you see an orange light blinking on your  monitor.  If your monitor falls off in less than 4 days, contact our Monitor department at [phone].  If your monitor becomes loose or falls off after 4 days call [employer] at [phone] for  suggestions on securing your monitor

## 2023-07-05 NOTE — Progress Notes (Unsigned)
Enrolled patient for a 14 day Zio XT  monitor to be mailed to patients home  °

## 2023-07-20 ENCOUNTER — Encounter: Payer: Self-pay | Admitting: Gastroenterology

## 2023-07-21 DIAGNOSIS — R002 Palpitations: Secondary | ICD-10-CM | POA: Diagnosis not present

## 2023-08-12 DIAGNOSIS — R002 Palpitations: Secondary | ICD-10-CM | POA: Diagnosis not present

## 2023-08-28 ENCOUNTER — Ambulatory Visit (HOSPITAL_COMMUNITY): Payer: Medicare PPO | Attending: Cardiology

## 2023-08-28 DIAGNOSIS — E785 Hyperlipidemia, unspecified: Secondary | ICD-10-CM | POA: Diagnosis not present

## 2023-08-28 DIAGNOSIS — R002 Palpitations: Secondary | ICD-10-CM | POA: Diagnosis not present

## 2023-08-28 DIAGNOSIS — I10 Essential (primary) hypertension: Secondary | ICD-10-CM | POA: Insufficient documentation

## 2023-08-28 LAB — ECHOCARDIOGRAM COMPLETE
Area-P 1/2: 4.49 cm2
S' Lateral: 2.5 cm

## 2024-02-07 ENCOUNTER — Other Ambulatory Visit: Payer: Self-pay | Admitting: Internal Medicine

## 2024-02-07 DIAGNOSIS — Z1231 Encounter for screening mammogram for malignant neoplasm of breast: Secondary | ICD-10-CM

## 2024-02-08 DIAGNOSIS — H9203 Otalgia, bilateral: Secondary | ICD-10-CM | POA: Diagnosis not present

## 2024-02-08 DIAGNOSIS — L309 Dermatitis, unspecified: Secondary | ICD-10-CM | POA: Diagnosis not present

## 2024-02-08 DIAGNOSIS — J309 Allergic rhinitis, unspecified: Secondary | ICD-10-CM | POA: Diagnosis not present

## 2024-02-08 DIAGNOSIS — I10 Essential (primary) hypertension: Secondary | ICD-10-CM | POA: Diagnosis not present

## 2024-02-08 DIAGNOSIS — H6993 Unspecified Eustachian tube disorder, bilateral: Secondary | ICD-10-CM | POA: Diagnosis not present

## 2024-02-08 DIAGNOSIS — J029 Acute pharyngitis, unspecified: Secondary | ICD-10-CM | POA: Diagnosis not present

## 2024-02-08 DIAGNOSIS — Z1152 Encounter for screening for COVID-19: Secondary | ICD-10-CM | POA: Diagnosis not present

## 2024-04-04 DIAGNOSIS — M810 Age-related osteoporosis without current pathological fracture: Secondary | ICD-10-CM | POA: Diagnosis not present

## 2024-04-04 DIAGNOSIS — I10 Essential (primary) hypertension: Secondary | ICD-10-CM | POA: Diagnosis not present

## 2024-04-04 DIAGNOSIS — E785 Hyperlipidemia, unspecified: Secondary | ICD-10-CM | POA: Diagnosis not present

## 2024-04-04 DIAGNOSIS — R946 Abnormal results of thyroid function studies: Secondary | ICD-10-CM | POA: Diagnosis not present

## 2024-04-04 DIAGNOSIS — K219 Gastro-esophageal reflux disease without esophagitis: Secondary | ICD-10-CM | POA: Diagnosis not present

## 2024-04-11 DIAGNOSIS — Z1331 Encounter for screening for depression: Secondary | ICD-10-CM | POA: Diagnosis not present

## 2024-04-11 DIAGNOSIS — R55 Syncope and collapse: Secondary | ICD-10-CM | POA: Diagnosis not present

## 2024-04-11 DIAGNOSIS — Z0000 Encounter for general adult medical examination without abnormal findings: Secondary | ICD-10-CM | POA: Diagnosis not present

## 2024-04-11 DIAGNOSIS — E669 Obesity, unspecified: Secondary | ICD-10-CM | POA: Diagnosis not present

## 2024-04-11 DIAGNOSIS — I4719 Other supraventricular tachycardia: Secondary | ICD-10-CM | POA: Diagnosis not present

## 2024-04-11 DIAGNOSIS — F419 Anxiety disorder, unspecified: Secondary | ICD-10-CM | POA: Diagnosis not present

## 2024-04-11 DIAGNOSIS — E785 Hyperlipidemia, unspecified: Secondary | ICD-10-CM | POA: Diagnosis not present

## 2024-04-11 DIAGNOSIS — R82998 Other abnormal findings in urine: Secondary | ICD-10-CM | POA: Diagnosis not present

## 2024-04-11 DIAGNOSIS — Z1339 Encounter for screening examination for other mental health and behavioral disorders: Secondary | ICD-10-CM | POA: Diagnosis not present

## 2024-04-11 DIAGNOSIS — K219 Gastro-esophageal reflux disease without esophagitis: Secondary | ICD-10-CM | POA: Diagnosis not present

## 2024-04-11 DIAGNOSIS — I10 Essential (primary) hypertension: Secondary | ICD-10-CM | POA: Diagnosis not present

## 2024-04-11 DIAGNOSIS — I2584 Coronary atherosclerosis due to calcified coronary lesion: Secondary | ICD-10-CM | POA: Diagnosis not present

## 2024-05-02 ENCOUNTER — Ambulatory Visit
Admission: RE | Admit: 2024-05-02 | Discharge: 2024-05-02 | Disposition: A | Discharge status: Home / Self Care | Source: Ambulatory Visit | Attending: Internal Medicine | Admitting: Internal Medicine

## 2024-05-02 DIAGNOSIS — Z1231 Encounter for screening mammogram for malignant neoplasm of breast: Secondary | ICD-10-CM | POA: Diagnosis not present

## 2024-06-23 DIAGNOSIS — H25013 Cortical age-related cataract, bilateral: Secondary | ICD-10-CM | POA: Diagnosis not present

## 2024-06-23 DIAGNOSIS — H5213 Myopia, bilateral: Secondary | ICD-10-CM | POA: Diagnosis not present

## 2024-06-23 DIAGNOSIS — H2513 Age-related nuclear cataract, bilateral: Secondary | ICD-10-CM | POA: Diagnosis not present
